# Patient Record
Sex: Female | Born: 1970 | Hispanic: Yes | Marital: Married | Smoking: Never smoker
Health system: Southern US, Community
[De-identification: ages and names within clinical notes are randomized; demographics above are authoritative.]

## PROBLEM LIST (undated history)

## (undated) DIAGNOSIS — T7840XA Allergy, unspecified, initial encounter: Secondary | ICD-10-CM

## (undated) DIAGNOSIS — D649 Anemia, unspecified: Secondary | ICD-10-CM

## (undated) HISTORY — PX: TUBAL LIGATION: SHX77

## (undated) HISTORY — DX: Allergy, unspecified, initial encounter: T78.40XA

## (undated) HISTORY — DX: Anemia, unspecified: D64.9

---

## 1999-07-08 ENCOUNTER — Other Ambulatory Visit: Admission: RE | Admit: 1999-07-08 | Discharge: 1999-07-08 | Payer: Self-pay | Admitting: Gynecology

## 1999-12-16 ENCOUNTER — Ambulatory Visit (HOSPITAL_COMMUNITY): Admission: RE | Admit: 1999-12-16 | Discharge: 1999-12-16 | Payer: Self-pay | Admitting: *Deleted

## 2000-05-13 ENCOUNTER — Inpatient Hospital Stay (HOSPITAL_COMMUNITY): Admission: AD | Admit: 2000-05-13 | Discharge: 2000-05-13 | Payer: Self-pay | Admitting: *Deleted

## 2000-05-18 ENCOUNTER — Inpatient Hospital Stay (HOSPITAL_COMMUNITY): Admission: AD | Admit: 2000-05-18 | Discharge: 2000-05-21 | Payer: Self-pay | Admitting: *Deleted

## 2000-07-26 ENCOUNTER — Encounter: Admission: RE | Admit: 2000-07-26 | Discharge: 2000-07-26 | Payer: Self-pay | Admitting: Obstetrics & Gynecology

## 2001-01-06 ENCOUNTER — Other Ambulatory Visit: Admission: RE | Admit: 2001-01-06 | Discharge: 2001-01-06 | Payer: Self-pay | Admitting: Gynecology

## 2001-01-17 ENCOUNTER — Encounter: Admission: RE | Admit: 2001-01-17 | Discharge: 2001-04-17 | Payer: Self-pay | Admitting: Gynecology

## 2001-07-18 ENCOUNTER — Inpatient Hospital Stay (HOSPITAL_COMMUNITY): Admission: AD | Admit: 2001-07-18 | Discharge: 2001-07-22 | Payer: Self-pay | Admitting: Gynecology

## 2001-07-18 ENCOUNTER — Encounter (INDEPENDENT_AMBULATORY_CARE_PROVIDER_SITE_OTHER): Payer: Self-pay

## 2001-07-24 ENCOUNTER — Encounter: Admission: RE | Admit: 2001-07-24 | Discharge: 2001-08-23 | Payer: Self-pay | Admitting: *Deleted

## 2001-08-31 ENCOUNTER — Other Ambulatory Visit: Admission: RE | Admit: 2001-08-31 | Discharge: 2001-08-31 | Payer: Self-pay | Admitting: Gynecology

## 2003-07-11 ENCOUNTER — Other Ambulatory Visit: Admission: RE | Admit: 2003-07-11 | Discharge: 2003-07-11 | Payer: Self-pay | Admitting: Obstetrics and Gynecology

## 2003-08-20 ENCOUNTER — Ambulatory Visit (HOSPITAL_COMMUNITY): Admission: RE | Admit: 2003-08-20 | Discharge: 2003-08-20 | Payer: Self-pay | Admitting: Obstetrics and Gynecology

## 2004-01-08 ENCOUNTER — Encounter: Admission: RE | Admit: 2004-01-08 | Discharge: 2004-01-08 | Payer: Self-pay | Admitting: *Deleted

## 2004-01-16 ENCOUNTER — Inpatient Hospital Stay (HOSPITAL_COMMUNITY): Admission: RE | Admit: 2004-01-16 | Discharge: 2004-01-19 | Payer: Self-pay | Admitting: Obstetrics & Gynecology

## 2004-01-22 ENCOUNTER — Inpatient Hospital Stay (HOSPITAL_COMMUNITY): Admission: AD | Admit: 2004-01-22 | Discharge: 2004-01-22 | Payer: Self-pay | Admitting: Obstetrics and Gynecology

## 2009-08-25 ENCOUNTER — Encounter (INDEPENDENT_AMBULATORY_CARE_PROVIDER_SITE_OTHER): Payer: Self-pay | Admitting: Obstetrics and Gynecology

## 2009-08-25 ENCOUNTER — Inpatient Hospital Stay (HOSPITAL_COMMUNITY): Admission: RE | Admit: 2009-08-25 | Discharge: 2009-08-28 | Payer: Self-pay | Admitting: Obstetrics and Gynecology

## 2011-01-27 LAB — CBC
HCT: 29.8 % — ABNORMAL LOW (ref 36.0–46.0)
HCT: 37 % (ref 36.0–46.0)
Hemoglobin: 12.4 g/dL (ref 12.0–15.0)
Hemoglobin: 9.9 g/dL — ABNORMAL LOW (ref 12.0–15.0)
MCHC: 33.3 g/dL (ref 30.0–36.0)
MCHC: 33.6 g/dL (ref 30.0–36.0)
MCV: 93.7 fL (ref 78.0–100.0)
MCV: 94.8 fL (ref 78.0–100.0)
Platelets: 131 10*3/uL — ABNORMAL LOW (ref 150–400)
Platelets: 158 10*3/uL (ref 150–400)
RBC: 3.14 MIL/uL — ABNORMAL LOW (ref 3.87–5.11)
RBC: 3.95 MIL/uL (ref 3.87–5.11)
RDW: 14.5 % (ref 11.5–15.5)
RDW: 14.7 % (ref 11.5–15.5)
WBC: 6.1 10*3/uL (ref 4.0–10.5)
WBC: 6.6 10*3/uL (ref 4.0–10.5)

## 2011-01-27 LAB — RPR: RPR Ser Ql: NONREACTIVE

## 2011-03-12 NOTE — Discharge Summary (Signed)
[facility]  Patient:    Tina Porter, Tina Porter Visit Number: [id number] MRN: [id number]          Service Type: Attending:  Nadyne Coombes. Fontaine, M.D. Dictated by:   Antony Contras, [facility]. Date:  07/19/01 Disc. Date: 07/22/01                             Discharge Summary  DISCHARGE DIAGNOSES:          1. Intrauterine pregnancy at term.                               2. History of gestational diabetes.                               3. Cephalopelvic disproportion.                               4. Primary cesarean section with delivery of                                  viable infant.  HISTORY OF PRESENT ILLNESS:   The patient is a 40 year old, primigravida with an LMP of October 05, 2001, [facility] July 12, 2001. Pregnancy was complicated by gestational diabetes which was diet controlled.  PRENATAL LABORATORY DATA:     Blood type O positive, antibody screen negative, RPR, HBsAg, HIV nonreactive. Rubella immune. MSAFP normal. GBS is negative.  HOSPITAL COURSE/TREATMENT:    The patient was admitted on July 19, 2001 with spontaneous rupture of membranes and spontaneous onset of labor. Cervix was high, fingertip, 50%. Fluid was noted to be clear. The patient was augmented with Pitocin. She made minimal progress and it was decided to proceed with low cervical transverse cesarean section. Procedure was performed by Dr. Farrel Gobble assisted by Dr. Salvadore Dom. The patient was delivered of a viable female infant, vertex presentation, overriding the symphysis pubis. Apgars were 8 and 9, birth weight 8 pounds 8 ounces, loose nuchal cord, small sessile fundal fibroid on the left, otherwise unremarkable. Tubes and ovaries were normal.  Postpartum, the patient did have some uterine atony which was managed with Methergine. She also did develop a temperature elevation of 101.4 which did spontaneously resolve. Otherwise, she had no problems voiding and did  well. She was able to be discharged on her third postpartum day in satisfactory condition.  CBC:  Hematocrit 29.6, hemoglobin 9.9, WBCs 7.3, platelets 104,000.  DISPOSITION:                  The patient is to follow up in six weeks, continue prenatal vitamins and iron, and Motrin and Tylox for pain. Dictated by:   Antony Contras, [facility] Attending:  Nadyne Coombes. Audie Box, M.D. DD:  08/11/01 TD:  08/15/01 Job: [id number]

## 2011-03-12 NOTE — Op Note (Signed)
[facility]  Patient:    Tina Porter, Tina Porter Visit Number: [id number] MRN: [id number]          Service Type: OBS Location: 910A 9139 [id number] Attending Physician:  Merrily Pew Dictated by:   Douglass Rivers, M.D. Proc. Date: 07/19/01 Admit Date:  07/18/2001                             Operative Report  PREOPERATIVE DIAGNOSIS:       Cephalopelvic disproportion.  POSTOPERATIVE DIAGNOSIS:      Cephalopelvic disproportion.  PROCEDURE:                    Primary cesarean section low flap transverse.  SURGEON:                      Douglass Rivers, M.D.  ASSISTANT:                    Ed Blalock. Burnadette Peter, M.D.  ANESTHESIA:                   Spinal.  ESTIMATED BLOOD LOSS:         300 cc.  FINDINGS:                     A viable female infant in the vertex presentation overriding the pubic symphysis, Apgars were 8/9, birth weight was 8-8. Loose nuchal cord. There was a small sessile fundal fibroid on the left, otherwise unremarkable. The tubes and ovaries were normal.  PATHOLOGY:                    Placenta.  COMPLICATIONS:                None.  PROCEDURE:                    The patient was taken to the operating room. Spinal anesthesia was induced and she was placed in the supine position, left lateral displacement, prepped and draped in the usual sterile fashion. A Pfannenstiel skin incision was made with a scalpel, carried through the underlying layer of fascia. With electrocautery the fascia was scored in the midline and incision was extended laterally with Mayo scissors. The inferior aspect of the incision was grasped with Kochers. The underlying rectus muscles were dissected off by blunt and sharp dissection. In a similar fashion, the superior aspect of the incision was grasped with Kochers and the underlying rectus muscles were dissected off. The rectus muscles were separated in the midline, the peritoneum was identified and entered bluntly.  The peritoneal incision was then extended superiorly and inferiorly with good visualization of the underlying bowel and bladder, the orientation to the uterus confirmed. The vesicouterine peritoneum was identified. The bladder blade was inserted. The vesicouterine peritoneum was identified, tented up, and entered sharply with the Metzenbaum scissors. The incision was extended laterally. The bladder flap was created digitally. The bladder blade was then reinserted in the lower uterine segment and incised in a transverse fashion with the scalpel. The lower uterine segment was noted to be markedly thin. The infant was delivered from the vertex presentation with the aid of baby Elliot forceps, loose nuchal cord was reduced, and then passed off to the awaiting pediatricians. Cord bloods were obtained. The uterus was incised and the placenta was allowed to separate naturally. The uterus was then  cleared of all clots and debris. The uterine incision was then repaired with a running locked fashion. The gutters were cleared of all clots and debris. The adnexa were inspected. The uterine incision was noted to be hemostatic. Tags were then cut. Also hemostatic underneath the bladder flap, the peritoneum muscles and fascia. The fascia was then closed with 0 Vicryl starting at the apex in a running fashion. The subcutaneous was irrigated. The skin was closed with staples. The patient tolerated the procedure well. Sponge lap and needle counts were correct x 2. She was transferred to the PACU in stable condition. She received Ancef intraoperatively.  INDICATIONS:  DESCRIPTION OF PROCEDURE: Dictated by:   Douglass Rivers, M.D. Attending Physician:  Merrily Pew DD:  07/19/01 TD:  07/19/01 Job: [id number]

## 2011-03-12 NOTE — Discharge Summary (Signed)
Tina Porter, Tina Porter                    ACCOUNT NO.:  [id number]   MEDICAL RECORD NO.:  [id number]                   PATIENT TYPE:  INP   LOCATION:  9127                                 [facility]ILITY:  [facility]   PHYSICIAN:  Lesly Dukes, M.D.              DATE OF BIRTH:  05/20/1969   DATE OF ADMISSION:  01/16/2004  DATE OF DISCHARGE:  01/19/2004                                 DISCHARGE SUMMARY   DISCHARGE DIAGNOSES:  1. Cesarean section.  2. Anemia.  3. Previous cesarean section.   PREVENTATIVE MEDICINE:  The patient discharged on the following medications:  1. Micronor one p.o. daily for birth control purposes.  2. Iron sulfate 325 mg one p.o. daily.   PAIN MEDICINES:  Ibuprofen 600 mg q.6h. as needed.   WOUND CARE:  Per instructions in the booklet.  The patient is to observe for  drainage or erythema.  Staples will be taken out in 2-3 days at [facility].   ACTIVITY:  Light activity.   SEXUAL ACTIVITY:  No sexual activity for 6 weeks.   FOLLOW-UP CARE:  At [facility] in 6 weeks for postpartum checkup.   The patient discharged to home in stable and improved condition with the  above diagnoses and medications.   BRIEF HOSPITAL COURSE:  This is a 40 year old G2 P1 with past medical  history of a previous low transverse C-section who was admitted to the  hospital for repeat low transverse C-section, scheduled.  Patient testing:  She was O positive, Rh negative, RPR nonreactive, rubella immune, HBsAg  negative, gonorrhea and chlamydia negative, GBS negative.  She had normal  prenatal care.  The patient was taken to the OR on January 16, 2004 and  underwent a repeat low transverse cesarean section by Dr. Penne Lash and Dr.  Rondel Jumbo.  Apgars at that point in time were 9 and 9.  The baby weighed 8  pounds 10 ounces.  Estimated blood loss of 800 mL.  The patient was under  spinal anesthesia with no complication.  Postpartum the patient did well,  was breastfeeding without any significant  difficulty.  Had some mild  abdominal pain around the incision site although the incision was clean,  dry, and intact and had some scant lochia.  The patient's pain was well  controlled on ibuprofen 600 mg q.6h.  The patient's hemoglobin did drop from  13 preoperatively to 9.3 postoperatively although hemostasis was achieved  and no significant bleeding postoperatively.  The patient was discharged on  January 19, 2004.  On day of discharge she was doing well,  ambulating well, was breastfeeding well, and had positive bowel sounds and  positive flatulence.  She had scant lochia on the day of discharge.  All of  her staples were left in secondary to mild obesity.  The patient will follow  up in 2-3 days for staple removal and was discharged on January 19, 2004.     Alvira Philips, M.D.  Lesly Dukes, M.D.    RM/MEDQ  D:  03/19/2004  T:  03/19/2004  Job:  [id number]

## 2011-03-12 NOTE — Op Note (Signed)
Tina Porter, Tina Porter                    ACCOUNT NO.:  [id number]   MEDICAL RECORD NO.:  [id number]                   PATIENT TYPE:  INP   LOCATION:  9127                                 [facility]ILITY[facility]  [facility]   PHYSICIAN:  Lesly Dukes, M.D.              DATE OF BIRTH:  05/20/1969   DATE OF PROCEDURE:  DATE OF DISCHARGE:                                 OPERATIVE REPORT   PROCEDURE:  Repeat low transverse cesarean section.   INDICATION:  Repeat.   PRIMARY SURGEON:  Lesly Dukes, M.D.   FIRST ASSISTANT:  Bradly Bienenstock, M.D.   ANESTHESIA:  Spinal.   FINDINGS:  Female infant born at 9:17, Apgars 9 and 9, with 8 pounds, 10  ounces.   ESTIMATED BLOOD LOSS:  800 cc.   PROCEDURE NOTE:  Informed consent was obtained and verified in chart.  The  patient was brought back to the operating suite, and spinal anesthesia was  placed by anesthesia service.  The patient was placed in the dorsal position  and prepped in the normal sterile fashion with Iodine.  A Foley catheter was  placed.  After prep, the patient was sterilely draped in the usual fashion.  Adequacy of anesthesia was verified with Allis clamps.  Once adequate  anesthesia was verified, a Pfannenstiel skin incision was performed, and  subcutaneous tissues were sharply dissected down to fascia.  Fascia was  opened sharply with scalpel and then extended laterally with Mayo scissors.  Underlying rectus tissue was sharply dissected from the fascia.  Secondary  scarring from previous section rectus muscles were opened sharply.  Peritoneum was identified and tented with hemostats and was entered sharply.  Peritoneal incision was extended inferiorly and superiorly first sharply and  then bluntly.  Bladder blade was inserted, and uterovesical peritoneum was  opened sharply in order to create a bladder flap.  Then the bladder flap was  used to retract the bladder down beneath the bladder blade.  The uterus was  then entered sharply  with a scalpel, and incision was extended laterally  with bandage scissors.  Fetal head was delivered through the incision with  assistance of fundal pressure, and the infant was bulb suctioned on the  abdomen prior to delivery of the body.  The body delivered easily.  Cord was  clamped and cut on the table, and the infant was handed to the waiting NICU  team.  Fetal blood sample was taken from umbilical cord.  Infant was  delivered at 9:17 a.m.  Placenta was delivered immediately afterwards and  easily.  A dry lap was used to verify that no placental fragments were  retained in the uterus.  Margins of the uterine incision were then  identified and clamped with Allis clamps to reapproximate the wound.  Pitocin 20 million units was started at this time as well, and the uterus  was massaged to firmness.  After the margins of the uterine incision had  been identified, Ring forceps was used to dilate the cervix, and the dirty  instrument was discarded.  The uterus was closed in two layers, first layer  of 0 Vicryl suture in a running locking fashion, second layer with 0 Vicryl  suture in an imbricating fashion.  Uterus was then irrigated and assessed  for adequate hemostasis.  After adequate  hemostasis had been obtained, the fascial layer was closed with 0 Vicryl  suture in a running fashion.  Subcutaneous tissues were irrigated and when  adequate hemostasis had been obtained, this was closed with staples.  The  patient was doing well at the time of this dictation and had been  transferred to the recovery room.     Bradly Bienenstock, M.D.                         Lesly Dukes, M.D.    Tina Porter  D:  01/16/2004  T:  01/17/2004  Job:  [id number]

## 2013-03-09 ENCOUNTER — Ambulatory Visit: Payer: Self-pay | Admitting: Emergency Medicine

## 2013-03-09 VITALS — BP 90/58 | HR 70 | Temp 98.7°F | Resp 16 | Ht 59.5 in | Wt 134.8 lb

## 2013-03-09 DIAGNOSIS — J309 Allergic rhinitis, unspecified: Secondary | ICD-10-CM

## 2013-03-09 MED ORDER — TRIAMCINOLONE ACETONIDE(NASAL) 55 MCG/ACT NA INHA: 2.0000 | Freq: Every day | NASAL | Status: DC

## 2013-03-09 MED ORDER — HYDROCOD POLST-CHLORPHEN POLST 10-8 MG/5ML PO LQCR: 5.0000 mL | Freq: Two times a day (BID) | ORAL | Status: DC | PRN

## 2013-03-09 NOTE — Patient Instructions (Addendum)
Alergias, en general (Allergies, Generic) El profesional que lo asiste le ha diagnosticado que usted padece de [place]. Las [employer] pueden ser ocasionadas por cualquier cosa a la que su organismo es sensible. Pueden ser alimentos, medicamentos, polen, sustancias qumicas y casi cualquiera de las cosas que lo rodean en su vida diaria que producen alrgenos. Un alrgeno es todo lo que hace que una sustancia produzca alergia. La herencia es uno de los factores que causa este problema. Esto significa que usted puede sufrir alguna de las alergias que sufrieron sus [place]. Las [employer] a la comida pueden ocurrir a Actuary. Estn entre las ms graves y Engineering geologist en peligro la vida. Algunos de los alimentos que comnmente producen [place] son la [place], los frutos de mar, los [place], los frutos secos, el trigo y la soja. SNTOMAS  Hinchazn alrededor de la boca.  Una erupcin roja que produce picazn o urticaria.  Vmitos o diarrea.  Dificultad para respirar. LAS REACCIONES ALRGICAS GRAVES PONEN EN PELIGRO LA VIDA . Esta reaccin se denomina anafilaxis. Puede ocasionar que la boca y la garganta se hinchen y produzca dificultad para respirar y Engineer, manufacturing. En reacciones graves, slo una pequea cantidad del alimento (por ejemplo, aceite de cacahuate en la ensalada) puede producir la muerte en pocos segundos. Las Omnic[employer] pueden ocurrir a Actuary. Se denominan as porque generalmente se producen durante la misma estacin todos los aos. Puede ser Neomia Dear reaccin al moho, al polen del csped o al polen de los rboles. Otras causas del problema son los alrgenos que contienen los caros del polvo del hogar, el pelaje de las mascotas y las esporas del moho. Los sntomas consisten en congestin nasal, picazn y secrecin nasal asociada con estornudos, y lagrimeo y [employer] ojos. Tambin puede haber picazn de la boca y los odos. Estos problemas aparecen cuando se entra en  contacto con el polen y otros alrgenos. Los alrgenos son las partculas que estn en el aire y a las que el organismo reacciona cuando existe una Automotive engineer. Esto hace que usted libere anticuerpos alrgicos. A travs de una cadena de eventos, estos finalmente hacen que usted libere histamina en la corriente sangunea. Aunque esto implica una proteccin para su organismo, es lo que le produce disconfort. Ese es el motivo por el que se le han indicado antihistamnicos para sentirse mejor. Si usted no Counselling psychologist cul es el alrgeno que le produjo la reaccin, puede someterse a una prueba de [place] o de piel. Las alergias no pueden curarse pero pueden controlarse con medicamentos. La fiebre de heno es un grupo de trastornos alrgicos estacionales Simplemente se tratan con medicamentos de venta libre como difenhidramina (Benadryl). Tome los medicamentos segn las indicaciones. No consuma alcohol ni conduzca mientras toma este medicamento. Consulte con el profesional que lo asiste o siga las instrucciones de uso para las dosis para nios. Si estos medicamentos no le Merchant navy officer, existen muchos otros nuevos que el profesional que lo asiste puede prescribirle. Podrn utilizarse medicamentos ms fuertes tales como un spray nasal, colirios y corticoides si los primeros medicamentos que prueba no lo [place]. Si todos estos fracasan, puede Chemical engineer otros tratamientos como la inmunoterapia o las inyecciones desensibilizantes. Haga una consulta de seguimiento con el profesional que lo asiste si los problemas continan. Estas alergias estacionales no ponen en peligro la vida. Generalmente se trata de una incomodidad que puede aliviarse con medicamentos. INSTRUCCIONES PARA EL CUIDADO DOMICILIARIO  Si no est seguro de que es  lo que le produce la reaccin, lleve un registro de los [employer] come y los sntomas que le siguen. Evite los Personal assistant.  Si presenta urticaria o  una erupcin cutnea:  Tome los medicamentos como se le indic.  Puede utilizar un antihistamnico de venta libre (difenhidramina) para la urticaria y Higher education careers adviser, segn sea necesario.  Aplquese compresas sobre la piel o tome baos de agua fra. Evite los baos o las duchas calientes. El calor puede hacer que la urticaria y la picazn empeoren.  Si usted es muy alrgico:  Como consecuencia de un tratamiento para una reaccin grave, puede necesitar ser hospitalizado para recibir un seguimiento intensivo.  Utilice un brazalete o collar de alerta mdico, indicando que usted es Best boy.  Usted y su familia deben aprender a Building services engineer adrenalina o a Chemical engineer un kit anafilctico.  Si usted ya ha sufrido una reaccin grave, siempre lleve el kit anafilctico o el EpiPen con usted. Si sufre una reaccin grave, utilice esta medicacin del modo en que se lo indic el profesional que lo asiste. Una falla puede conllevar consecuencias fatales. SOLICITE ATENCIN MDICA SI:  Sospecha que puede sufrir una alergia a algn alimento. Los sntomas generalmente ocurren dentro de los 30 minutos posteriores a haber ingerido el alimento.  Los sntomas persistieron durante [address]  [address] o han empeorado.  Desarrolla nuevos sntomas.  Quiere volver a probar o que su hijo consuma nuevamente un alimento o bebida que usted cree que le causa una reaccin Counselling psychologist. Nunca lo haga si ha sufrido una reaccin anafilctica a ese alimento o a esa bebida con anterioridad. Slo intntelo bajo la supervisin del mdico. SOLICITE ATENCIN MDICA DE INMEDIATO SI:  Presenta dificultad para respirar, jadea o tiene una sensacin de opresin en el pecho o en la garganta.  Tiene la boca hinchada, o presenta urticaria, hinchazn o picazn en todo el cuerpo.  Ha sufrido una reaccin grave que ha respondido a Engineer, manufacturing systems o al EpiPen. Estas reacciones pueden volver a presentarse cuando haya terminado la  medicacin. Estas reacciones deben considerarse como que ponen en peligro la vida. EST SEGURO QUE:   Comprende las instrucciones para el alta mdica.  Controlar su enfermedad.  Solicitar atencin mdica de inmediato segn las indicaciones. Document Released: 10/11/2005 Document Revised: 01/03/2012 [facility] Patient Information 2013 [place], [place].

## 2013-03-09 NOTE — Progress Notes (Signed)
[facility] [address] [place] [address] [phone]  Date:  03/09/2013   Name:  Tina Porter   DOB:  Jan 30, 1971   MRN:  [id number]  PCP:  No PCP Per Patient    Chief Complaint: Cough, Nasal Congestion and Itchy Eye   History of Present Illness:  Tina Porter is a 42 y.o. very pleasant female patient who presents with the following:  Ill with watery nasal drainage and post nasal drainage.  Sore throat, non productive cough.  No wheezing or shortness of breath.  No nausea or vomiting.  No rash or stool change.  No history of allergies.  No improvement with over the counter medications or other home remedies. Denies other complaint or health concern today.   There are no active problems to display for this patient.   History reviewed. No pertinent past medical history.  Past Surgical History  Procedure Laterality Date  . Cesarean section      History  Substance Use Topics  . Smoking status: Never Smoker   . Smokeless tobacco: Not on file  . Alcohol Use: No    Family History  Problem Relation Age of Onset  . Diabetes Father   . Hyperlipidemia Father   . Allergy (severe) Sister   . Allergy (severe) Brother     No Known Allergies  Medication list has been reviewed and updated.  No current outpatient prescriptions on file prior to visit.   No current facility-administered medications on file prior to visit.    Review of Systems:  As per HPI, otherwise negative.    Physical Examination: Filed Vitals:   03/09/13 1556  BP: 90/58  Pulse: 70  Temp: 98.7 F (37.1 C)  Resp: 16   Filed Vitals:   03/09/13 1556  Height: 4' 11.5" (1.511 m)  Weight: 134 lb 12.8 oz (61.145 kg)   Body mass index is 26.78 kg/(m^2). Ideal Body Weight: Weight in (lb) to have BMI = 25: 125.6  GEN: WDWN, NAD, Non-toxic, A & O x 3 HEENT: Atraumatic, Normocephalic. Neck supple. No masses, No LAD. Ears and Nose: No external deformity. CV: RRR, No M/G/R.  No JVD. No thrill. No extra heart sounds. PULM: CTA B, no wheezes, crackles, rhonchi. No retractions. No resp. distress. No accessory muscle use. ABD: S, NT, ND, +BS. No rebound. No HSM. EXTR: No c/c/e NEURO Normal gait.  PSYCH: Normally interactive. Conversant. Not depressed or anxious appearing.  Calm demeanor.    Assessment and Plan: Seasonal allergic rhinitis Allegra nasacort Robitussin DM   Signed,  Phillips Odor, MD

## 2015-02-27 ENCOUNTER — Other Ambulatory Visit: Payer: Self-pay | Admitting: Emergency Medicine

## 2015-02-27 DIAGNOSIS — Z1231 Encounter for screening mammogram for malignant neoplasm of breast: Secondary | ICD-10-CM

## 2015-03-07 ENCOUNTER — Encounter (HOSPITAL_COMMUNITY): Payer: Self-pay

## 2015-04-03 ENCOUNTER — Encounter (HOSPITAL_COMMUNITY): Payer: Self-pay

## 2015-04-03 ENCOUNTER — Ambulatory Visit (HOSPITAL_COMMUNITY)
Admission: RE | Admit: 2015-04-03 | Discharge: 2015-04-03 | Disposition: A | Discharge status: Home / Self Care | Payer: Self-pay | Source: Ambulatory Visit | Attending: Emergency Medicine | Admitting: Emergency Medicine

## 2015-04-03 ENCOUNTER — Ambulatory Visit (HOSPITAL_COMMUNITY)
Admission: RE | Admit: 2015-04-03 | Discharge: 2015-04-03 | Disposition: A | Discharge status: Home / Self Care | Payer: Self-pay | Source: Ambulatory Visit | Attending: Obstetrics and Gynecology | Admitting: Obstetrics and Gynecology

## 2015-04-03 VITALS — BP 118/74 | Temp 98.4°F | Ht 60.0 in | Wt 130.0 lb

## 2015-04-03 DIAGNOSIS — Z01419 Encounter for gynecological examination (general) (routine) without abnormal findings: Secondary | ICD-10-CM

## 2015-04-03 DIAGNOSIS — Z1231 Encounter for screening mammogram for malignant neoplasm of breast: Secondary | ICD-10-CM

## 2015-04-03 NOTE — Progress Notes (Signed)
CLINIC:  Breast & Cervical Cancer Control Program (BCCCP) Clinic  REASON FOR VISIT: Well-woman exam with routine gynecological exam  HISTORY OF PRESENT ILLNESS:  [name] is a 44 y.o. female who presents to the [facility] today for clinical breast exam and routine gynecological exam.  She does not have a family history of breast cancer.  Her last pap smear was 11/2008 and was negative.  She does not have a history of abnormal pap smears in the past.  She denies any breast or gynecologic complaints today.   REVIEW OF SYSTEMS:  She denies any breast nodularity, skin changes, nipple inversion, or nipple retraction.  She denies any abnormal pelvic pain, pressure, or vaginal bleeding.   ALLERGIES: No Known Allergies  CURRENT MEDICATIONS:  Current Outpatient Prescriptions on File Prior to Encounter  Medication Sig Dispense Refill  . chlorpheniramine-HYDROcodone (TUSSIONEX PENNKINETIC ER) 10-8 MG/5ML LQCR Take 5 mLs by mouth every 12 (twelve) hours as needed. (Patient not taking: Reported on 04/03/2015) 60 mL 0  . fexofenadine (ALLEGRA) 30 MG/5ML suspension Take 30 mg by mouth daily.    Marland Kitchen loratadine (CLARITIN) 10 MG tablet Take 10 mg by mouth daily.    Marland Kitchen triamcinolone (NASACORT AQ) 55 MCG/ACT nasal inhaler Place 2 sprays into the nose daily. (Patient not taking: Reported on 04/03/2015) 1 Inhaler 12   No current facility-administered medications on file prior to encounter.    SOCIAL HISTORY:  [name] is married and has 3 sons, ages 69, 25, and 29.  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   PHYSICAL EXAM:  Vitals:  Filed Vitals:   04/03/15 1043  BP: 118/74  Temp: 98.4 F (36.9 C)    General: Well-nourished, well-appearing female in no acute distress.  She is unaccompanied in clinic today.  Tina Porter, Tina Porter was present during physical exam for this patient.  Breasts: Bilateral breasts exposed and observed  with patient standing (arms at side, arms on hips, arms on hips flexed forward, and arms over head).  No gross abnormalities including breast skin puckering or dimpling noted on observation.  Breasts symmetrical without evidence of skin redness, thickening, or peau d'orange appearance. No nipple retraction or nipple discharge noted bilaterally.  No breast nodularity palpated in bilateral breasts.  Axillary lymph nodes: No axillary lymphadenopathy bilaterally.   GU:  -External genitalia: No lesions, swelling, or discharge. Even hair distribution as expected.  -Vagina: Pink, moist. No lesions. Normal white discharge noted in vaginal canal.  -Cervix: Cervix tilted and pink without lesions. Cervical os patent. Scant amount white cervical discharge noted.  -Uterus: Bimanual exam demonstrates no uterine mass or tenderness on palpation. Uterus in normal position and normal size.  -Adnexae: Bimanual exam demonstrates no ovarian masses or tenderness on palpation.  -Rectovaginal: No lesions noted to rectum. Rectal tone intact.  No masses or nodularity palpated by bimanual rectovaginal exam.     ASSESSMENT & PLAN:   1. Breast cancer screening: [name] has no palpable breast abnormalities on her clinical breast exam today.  She will receive her screening mammogram as scheduled.  She will be contracted by the imaging center for results of the mammogram, either by letter or phone within the next few weeks.  She was given instructions and educational materials regarding breast self-awareness. [name] is aware of this plan and agrees with it.   2. Cervical cancer screening: [name] has a normal pelvic exam today.  A pap  smear was completed today per protocol.  She tolerated the procedure without complaints.  She will be contacted by one of our [employer] nurses in the next few weeks to review the results of the pap smear with the patient.    [name] was encouraged to ask questions and all questions were  answered to her satisfaction.    Lubertha Basque, NP [facility]  [phone]

## 2015-04-07 LAB — CYTOLOGY - PAP

## 2015-04-16 ENCOUNTER — Telehealth (HOSPITAL_COMMUNITY): Payer: Self-pay | Admitting: *Deleted

## 2015-04-16 NOTE — Telephone Encounter (Signed)
Telephoned patient at home # and discussed negative pap smear results. HPV was negative. Next pap due in 3 years. Patient voiced understanding. Used interpreter Julie Sowell.  

## 2016-07-20 ENCOUNTER — Other Ambulatory Visit: Payer: Self-pay | Admitting: Obstetrics and Gynecology

## 2016-07-20 DIAGNOSIS — Z1231 Encounter for screening mammogram for malignant neoplasm of breast: Secondary | ICD-10-CM

## 2016-08-05 ENCOUNTER — Ambulatory Visit (HOSPITAL_COMMUNITY)
Admission: RE | Admit: 2016-08-05 | Discharge: 2016-08-05 | Disposition: A | Discharge status: Home / Self Care | Payer: Self-pay | Source: Ambulatory Visit | Attending: Obstetrics and Gynecology | Admitting: Obstetrics and Gynecology

## 2016-08-05 ENCOUNTER — Encounter (HOSPITAL_COMMUNITY): Payer: Self-pay

## 2016-08-05 ENCOUNTER — Ambulatory Visit (INDEPENDENT_AMBULATORY_CARE_PROVIDER_SITE_OTHER): Payer: Self-pay | Admitting: Physician Assistant

## 2016-08-05 ENCOUNTER — Ambulatory Visit
Admission: RE | Admit: 2016-08-05 | Discharge: 2016-08-05 | Disposition: A | Discharge status: Home / Self Care | Payer: No Typology Code available for payment source | Source: Ambulatory Visit | Attending: Obstetrics and Gynecology | Admitting: Obstetrics and Gynecology

## 2016-08-05 VITALS — BP 122/70 | Temp 98.7°F | Ht 60.0 in | Wt 136.4 lb

## 2016-08-05 VITALS — BP 100/64 | HR 75 | Temp 98.5°F | Resp 16 | Ht 60.0 in | Wt 136.4 lb

## 2016-08-05 DIAGNOSIS — Z1239 Encounter for other screening for malignant neoplasm of breast: Secondary | ICD-10-CM

## 2016-08-05 DIAGNOSIS — Z1231 Encounter for screening mammogram for malignant neoplasm of breast: Secondary | ICD-10-CM

## 2016-08-05 DIAGNOSIS — M7711 Lateral epicondylitis, right elbow: Secondary | ICD-10-CM

## 2016-08-05 MED ORDER — MELOXICAM 7.5 MG PO TABS: 7.5000 mg | ORAL_TABLET | Freq: Every day | ORAL | 0 refills | Status: DC

## 2016-08-05 NOTE — Progress Notes (Addendum)
Error

## 2016-08-05 NOTE — Patient Instructions (Addendum)
Take this medication for your pain. You can take one in the morning and one at night, or two at once. Please see the below information for information about your elbow.   You can buy an arm brace for "medial epicondylitis" aka tennis elbow at [facility]: Address: [address] [place], [place] [address]  Hours:  Open today  9AM-5:30PM                       Phone: [phone]   Thank you for coming in today. I hope you feel we met your needs.  Feel free to call [facility] if you have any questions or further requests.  Please consider signing up for MyChart if you do not already have it, as this is a great way to communicate with me.  Best,  Whitney McVey, PA-C   IF you received an x-ray today, you will receive an invoice from [facility]. Please contact [facility] at [phone] with questions or concerns regarding your invoice.   IF you received labwork today, you will receive an invoice from [employer]. Please contact [facility] at [phone] with questions or concerns regarding your invoice.   Our billing staff will not be able to assist you with questions regarding bills from these companies.  You will be contacted with the lab results as soon as they are available. The fastest way to get your results is to activate your My Chart account. Instructions are located on the last page of this paperwork. If you have not heard from [place] regarding the results in 2 weeks, please contact this office.      Epicondilitis lateral con rehabilitacin (Lateral Epicondylitis With Rehab) La epicondilitis lateral consiste en la inflamacin y dolor alrededor de la regin externa del codo. El dolor tiene su origen en la inflamacin de los tendones del antebrazo que extienden la [place]. La epicondilitis lateral, tambin es denominada codo de [place] debido a que es muy frecuente [employer] jugadores de tenis. Sin embargo, Facilities manager individuo que extienda la mueca de [place] repetitiva. Si esta afeccin no se trata, puede transformarse en un problema crnico. SNTOMAS  Dolor y sensibilidad e inflamacin en la zona externa (lateral) del codo.  Dolor o debilidad al tomar [employer].  Dolor que [place] con los movimientos de rotacin de la mueca (jugar al tenis, usar un Information systems manager, abrir una puerta o un frasco).  Dolor al levantar objetos, inclusive [place] taza de caf. CAUSAS  La causa de la epicondilitis lateral es la inflamacin de los tendones que extienden la [place]. Entre las causas se incluyen:  Psychologist, forensic repetitivo y distensin de los msculos y los tendones que extienden la [place].  Cambio repentino en el nivel o intensidad de la [place].  Agarre incorrecto en los deportes con raqueta.  Tamao incorrecto del puo de la raqueta (con frecuencia demasiado grande).  Posicin o tcnica incorrecta al [employer] un golpe (generalmente con el dorso de la mano; llevada por el codo).  Utilizar una raqueta demasiado pesada. LOS RIESGOS AUMENTAN CON:  Los deportes u ocupaciones que requieren movimientos repetitivos y extenuantes del antebrazo y la [place] (tenis, squash, deportes con raqueta, carpintera).  Poca fuerza y flexibilidad de la [place] y [place].  Precalentamiento y elongacin inadecuados antes de la [place].  Regreso a la actividad antes de [employer] curacin, la rehabilitacin y Museum/gallery exhibitions officer. PREVENCIN  Precalentamiento adecuado y elongacin antes de la [place].  Mantener la forma fsica:  Kerry Hough, flexibilidad y resistencia muscular.  Capacidad cardiovascular.  Utilice un equipo que le ajuste adecuadamente.  Usar la tcnica correcta y Best boy un entrenador que corrija la tcnica incorrecta.  Use un vendaje para el codo apropiado para el tenis (contrafuerza). PRONSTICO El curso de la enfermedad depende del grado de la lesin. Si se trata adecuadamente, los casos agudos  (sntomas que duran menos de 4 semanas) generalmente se resuelven en un perodo de 2 a 6 semanas. Los casos crnicos (que duran ms tiempo) se resuelven en un lapso de 3 a 6 meses, pero pueden requerir de tratamiento fisioteraputico. COMPLICACIONES RELACIONADAS  La recurrencia frecuente de los sntomas puede dar como resultado un problema crnico. Un tratamiento adecuado en su inicio disminuye la probabilidad de recurrencias.  La inflamacin crnica, degeneracin cicatrizal del tendn y ruptura parcial del tendn, requieren [place].  Demora de la curacin o de la resolucin de los sntomas. TRATAMIENTO El tratamiento inicial consiste en la toma de medicamentos y la aplicacin de hielo para Best boy y reducir la hinchazn. Los ejercicios de elongacin y fortalecimiento pueden ayudar a reducir las molestias si se realizan con regularidad. Podr realizar estos ejercicios en su casa, si se trata de una afeccin aguda. Los casos crnicos pueden requerir la derivacin a un fisioterapeuta para Film/video editor evaluacin y Manufacturing systems engineer. Su mdico podr indicarle inyecciones con corticoides para reducir la inflamacin. Raras veces es necesario someterse a [place]. MEDICAMENTOS   Si es necesaria la administracin de medicamentos para Conservation officer, historic buildings, se recomiendan los antiinflamatorios no esteroides, (aspirina e ibuprofeno) u otros calmantes menores (acetaminofeno).  No tome medicamentos para el dolor dentro de los 7 das previos a la [place].  El profesional podr prescribirle calmantes si lo considera necesario. Utilcelos como se le indique y slo cuando lo necesite.  Se podrn recomendar inyecciones de corticoesteorides. Estas inyecciones deben reservarse para los casos graves, porque slo se pueden administrar una determinada cantidad de veces. CALOR Y FRO  El fro debe aplicarse durante 10 a 15 minutos cada 2  3 horas para reducir la inflamacin y Conservation officer, historic buildings e inmediatamente despus  de cualquier actividad que agrava los sntomas. Utilice bolsas o un masaje de hielo.  El calor puede usarse antes de Neurosurgeon y [place] fortalecimiento indicadas por el profesional, le fisioterapeuta o Industrial/product designer. Utilice una bolsa trmica o un pao hmedo. SOLICITE ATENCIN MDICA SI: Los sntomas empeoran o no mejoran en 2 semanas, a pesar de Chiropodist. EJERCICIOS EJERCICIOS DE AMPLITUD DE MOVIMIENTOS Y ELONGACIN - Epicondilitis lateral (codo de [place]) Estos ejercicios le ayudarn en la recuperacin de la lesin. Los sntomas podrn desaparecer con o sin mayor intervencin del profesional, el fisioterapeuta o Industrial/product designer. Al completar estos ejercicios, recuerde:   Restaurar la flexibilidad del tejido ayuda a que las articulaciones recuperen el movimiento normal. Esto permite que el movimiento y la actividad sea ms saludables y menos dolorosos.  Para que sea efectiva, cada elongacin debe realizarse durante al menos 30 segundos.  La elongacin nunca debe ser dolorosa. Deber sentir slo un alargamiento suave o elongacin del tejido que estira. [place] de la [place], asistida  Extienda el codo derecha / izquierdo con los dedos apuntando [place] abajo.*  Tire suavemente la palma de la mano hacia usted, hasta que sienta un ligero estiramiento de la parte superior del [place].  Mantenga esta posicin durante __________ segundos. Reptalo __________ veces. Realice este estiramiento __________ Vicenta Aly por da.  *Realice  este ejercicio con el codo flexionado en lugar de extendido si el mdico, fisioterapeuta o entrenador se lo indican. [place] de la [place], asistida  Extienda el codo derecha / izquierdo con la palma Edison arriba.*  Tire suavemente la palma y la punta de los dedos [place] atrs, para que la [place] se extienda y los dedos apunten hacia el suelo.  Debe sentir un ligero estiramiento en la parte  interior del antebrazo.  Mantenga esta posicin durante __________segundos. Reptalo __________ veces. Realice este estiramiento __________ Vicenta Aly por da. *Realice este ejercicio con el codo flexionado en lugar de extendido si el mdico, fisioterapeuta o entrenador se lo indican. FUERZA - Flexin de la [employer] la palma de la mano derecha / izquierdo plana sobre una mesa, con el codo ligeramente doblado. Los dedos deben apuntar hacia el lado contrario del cuerpo.  Presione suavamente la parte de atrs de la mano en la mesa y enderece el codo. Debe sentir un ligero estiramiento en la parte superior del antebrazo.  Mantenga esta posicin durante __________ segundos. Reptalo __________ veces. Realice este estiramiento __________ Vicenta Aly por da.  [place] de la [employer] puntas de los dedos de la mano derecha / izquierdo plana sobre una mesa, con el codo ligeramente doblado. Los dedos deben apuntar [employer].  Presione suavamente los dedos y la mano en la mesa y enderece el codo. Debe sentir un ligero estiramiento en la parte interna del antebrazo.  Mantenga esta posicin durante __________ segundos. Reptalo __________ veces. Realice este estiramiento __________ Vicenta Aly por da.  EJERCICIOS DE FORTALECIMIENTO - Epicondilitis lateral (codo de [place]) Estos ejercicios le ayudarn en la recuperacin de la lesin. Los sntomas podrn desaparecer con o sin mayor intervencin del profesional, el fisioterapeuta o Industrial/product designer. Al completar estos ejercicios, recuerde:   Los msculos pueden ganar tanto la resistencia como la fuerza que necesita para sus actividades diarias a travs de ejercicios controlados.  Realice los ejercicios como se lo indic el mdico, el fisioterapeuta o Industrial/product designer. Aumente la resistencia y repeticiones segn se le haya indicado.  Podr experimentar dolor o cansancio muscular, pero el dolor o molestia que trata de eliminar a travs de los  ejercicios nunca debe empeorar. Si el dolor empeora, detngase y asegrese de que est siguiendo las directivas correctamente. Si an siente dolor luego de Optometrist lo ajustes necesarios, deber discontinuar el ejercicio hasta que pueda conversar con el profesional sobre el problema. FUERZA - Flexores de la mueca  Sintese con el antebrazo derecha / izquierdo con la palma hacia arriba y completamente apoyado sobre una mesa o [place]. El codo Neurosurgeon en reposo y a la altura de los hombros. Haga que la [place] se extienda sobre los extremos de la superficie.  Sostenga sin apretar un peso de __________, Merla Riches goma o tubo para ejercicios en ambas manos, y doble lentamente la mano hacia el [place].  Mantenga esta posicin durante __________ segundos. Baje lentamente la [employer] posicin inicial, de forma controlada. Reptalo __________ veces. Realice este estiramiento __________ Vicenta Aly por da.  FUERZA - Extensores de la mueca  Sintese con el antebrazo derecha / izquierdo con la palma hacia abajo y completamente apoyado sobre una mesa o [place]. El codo Neurosurgeon en reposo y a la altura de los hombros. Haga que la [place] se extienda sobre los extremos de la superficie.  Sostenga sin apretar un peso de __________, Merla Riches goma o tubo para ejercicios en  ambas manos, y doble lentamente la mano hacia el [place].  Mantenga esta posicin durante __________ segundos. Baje lentamente la [employer] posicin inicial, de forma controlada. Reptalo __________ veces. Realice este estiramiento __________ Vicenta Aly por da.  FUERZA - Desviacin ulnar  Prese sosteniendo un peso de ____________________ en su mano derecha / izquierdo, o sintese sosteniendo una banda de goma para ejercicios, con el brazo sano apoyado en una mesa o mesada.  Mueva la [place], para que el dedo meique apunte hacia el antebrazo y Counselling psychologist en contra del mismo.  Mantenga esta posicin durante __________ segundos y luego  lentamente baje la mueca a la posicin inicial. Reptalo __________ veces. Realice este ejercicio __________ veces por da. FUERZA - Desviacin radial  Prese sosteniendo un peso de ____________________ en su mano derecha / izquierdo, o sintese sosteniendo una banda de goma para ejercicios, con el brazo lesionado apoyado en una mesa o [place].  Eleve la mano hacia arriba, por delante suyo o tire [place] arriba la banda de [place].  Mantenga esta posicin durante __________ segundos y luego lentamente baje la mueca a la posicin inicial. Reptalo __________ veces. Realice este estiramiento __________ Vicenta Aly por da. FUERZA - Supinadores del antebrazo  Sintese con su antebrazo derecha / izquierdo apoyado sobre una mesa, manteniendo el codo por debajo de la altura del hombro. Apoye la [employer] borde, con la palma [place].  Suavemente tome un martillo o un cucharn de sopa.  Sin mover el codo, gire lentamente la palma y la mano hacia arriba para colocar el "pulgar arriba".  Mantenga esta posicin durante __________ segundos. Vuelva lentamente a la posicin inicial. Reptalo __________ veces. Realice este estiramiento __________ Vicenta Aly por da.  FUERZA - Pronadores del antebrazo  Sintese con su antebrazo derecha / izquierdo apoyado sobre una mesa, manteniendo el codo por debajo de la altura del hombro. Apoye la [employer] borde, con la palma [place].  Suavemente tome un martillo o un cucharn de sopa.  Sin mover el codo, gire lentamente la palma y la mano hacia arriba para colocar el "pulgar arriba".  Mantenga esta posicin durante __________ segundos. Vuelva lentamente a la posicin inicial. Reptalo __________ veces. Realice este estiramiento __________ Vicenta Aly por da.  [place] una pelota de tenis, una esponja dura o una media larga y [place].  Apritela lo ms fuerte que pueda, sin Corporate treasurer.  Mantenga esta posicin durante __________ segundos.  Sultela lentamente. Reptalo __________ veces. Realice este estiramiento __________ Vicenta Aly por da.  FUERZA - Extensores del codo, isomtrica  Prese o sintese erguido sobre una superficie firme. Coloque su brazo Recruitment consultant / izquierdo de modo que la palma de su mano quede frente al [place] y a la altura de su cintura.  Coloque la mano opuesta sobre lado inferior del [place]. Empuje suavemente hacia arriba mientras su brazo derecha / izquierdo opone resistencia. Empuje tan intensamente como pueda con ambos brazos sin causar Counselling psychologist ni realizar movimientos con su codo derecha / izquierdo. Mantenga esta posicin durante __________ segundos. Libere la tensin de ambos brazos gradualmente. Permita que sus msculos se relajen completamente antes de repetir.   Esta informacin no tiene Marine scientist el consejo del mdico. Asegrese de hacerle al mdico cualquier pregunta que tenga.   Document Released: 07/28/2006 Document Revised: 02/25/2015 Elsevier Interactive Patient Education [employer].

## 2016-08-05 NOTE — Progress Notes (Signed)
No complaints today.   Pap Smear:  Pap smear not completed today. Last Pap smear was 04/03/2015 in BCCCP clinic and normal with negative HPV with an absent endocervical transformation zone. Per patient no history of an abnormal Pap smear. Next Pap smear due June 2019. Last Pap smear result is in EPIC.  Physical exam: Breasts Breasts symmetrical. No skin abnormalities bilateral breasts. No nipple retraction bilateral breasts. No nipple discharge bilateral breasts. No lymphadenopathy. No lumps palpated bilateral breasts. No complaints of pain or tenderness on exam. [facility]. Appointment scheduled for Thursday, August 05, 2016 at 1020.        Pelvic/Bimanual No Pap smear completed today since last Pap smear was 04/03/2015. Pap smear not indicated per BCCCP guidelines.   Smoking History: Patient has never smoked.  Patient Navigation: Patient education provided. [facility].

## 2016-08-05 NOTE — Patient Instructions (Signed)
Explained breast self awareness with Tina Porter. Patient did not need a Pap smear today due to last Pap smear and HPV typing was 04/03/2015. Let her know BCCCP will cover Pap smears every 3 years unless has a history of abnormal Pap smears. [facility]. Appointment scheduled for Thursday, August 05, 2016 at 1020. Let patient know the Breast Center will follow up with her within the next couple weeks with results of mammogram by letter or phone. Tina Porter verbalized understanding.  Tina Porter, Tina Maserhristine Poll, RN 11:22 AM

## 2016-08-09 ENCOUNTER — Encounter (HOSPITAL_COMMUNITY): Payer: Self-pay | Admitting: *Deleted

## 2016-08-10 NOTE — Progress Notes (Signed)
Tina Porter is a 45 year old female who presents to clinic for right elbow pain x two months. She cleans houses for a living. Pain increases when she is in bed at night and occasionally while she is working. Pain is located on the outside of her right arm. She has not tried anything for her pain. Denies MOI, sensory changes, increase in temperature at joints, decreased range of motion or strength.  She has never had this before.  Also notes pain in the joints of all of her fingers, right and left hands.   Review of Systems  Constitutional: Negative for chills, diaphoresis and fever.  HENT: Negative for congestion.   Respiratory: Negative for cough and shortness of breath.   Cardiovascular: Negative for chest pain and palpitations.  Musculoskeletal: Positive for joint pain (fingers ) and myalgias (right elbow). Negative for back pain and falls.  Neurological: Negative for dizziness, sensory change, weakness and headaches.     There are no active problems to display for this patient.   Current Outpatient Prescriptions on File Prior to Visit  Medication Sig Dispense Refill  . fexofenadine (ALLEGRA) 30 MG/5ML suspension Take 30 mg by mouth daily.    Marland Kitchen. loratadine (CLARITIN) 10 MG tablet Take 10 mg by mouth daily.    Marland Kitchen. triamcinolone (NASACORT AQ) 55 MCG/ACT nasal inhaler Place 2 sprays into the nose daily. 1 Inhaler 12   No current facility-administered medications on file prior to visit.     No Known Allergies  Objective:  BP 100/64 (BP Location: Right Arm, Patient Position: Sitting, Cuff Size: Small)   Pulse 75   Temp 98.5 F (36.9 C) (Oral)   Resp 16   Ht 5' (1.524 m)   Wt 136 lb 6.4 oz (61.9 kg)   LMP 07/14/2016 (Approximate)   SpO2 98%   BMI 26.64 kg/m   Physical Exam  Constitutional: She is oriented to person, place, and time and well-developed, well-nourished, and in no distress. No distress.  Cardiovascular: Normal rate, regular rhythm and normal heart sounds.    Musculoskeletal:       Right elbow: She exhibits normal range of motion, no swelling, no effusion, no deformity and no laceration. Tenderness found. Lateral epicondyle tenderness noted. No medial epicondyle and no olecranon process tenderness noted.  Pain with passive wrist flexion and extension with elbow in full extension. Pain is relieved with pressure applied to proximal forearm. No sensory deficits noted.   Neurological: She is alert and oriented to person, place, and time. GCS score is 15.  Skin: Skin is warm and dry.  Psychiatric: Mood, memory, affect and judgment normal.  Vitals reviewed.   Assessment and Plan :  1. Lateral epicondylitis of right elbow - meloxicam (MOBIC) 7.5 MG tablet; Take 1 tablet (7.5 mg total) by mouth daily.  Dispense: 30 tablet; Refill: 0 - patient [facility]. Information given. Stretches demonstrated and printed out for patient. She understands and agrees with plan. RTC if no improvement.    Marco CollieWhitney Knolan Simien, PA-C  [facility] 08/05/2016 11:13 AM

## 2017-11-07 ENCOUNTER — Other Ambulatory Visit: Payer: Self-pay | Admitting: Obstetrics and Gynecology

## 2017-11-07 DIAGNOSIS — Z1231 Encounter for screening mammogram for malignant neoplasm of breast: Secondary | ICD-10-CM

## 2017-12-01 ENCOUNTER — Ambulatory Visit (HOSPITAL_COMMUNITY)
Admission: RE | Admit: 2017-12-01 | Discharge: 2017-12-01 | Disposition: A | Discharge status: Home / Self Care | Payer: Self-pay | Source: Ambulatory Visit | Attending: Obstetrics and Gynecology | Admitting: Obstetrics and Gynecology

## 2017-12-01 ENCOUNTER — Ambulatory Visit
Admission: RE | Admit: 2017-12-01 | Discharge: 2017-12-01 | Disposition: A | Discharge status: Home / Self Care | Payer: No Typology Code available for payment source | Source: Ambulatory Visit | Attending: Obstetrics and Gynecology | Admitting: Obstetrics and Gynecology

## 2017-12-01 ENCOUNTER — Encounter (HOSPITAL_COMMUNITY): Payer: Self-pay

## 2017-12-01 VITALS — BP 102/68 | Ht 60.0 in

## 2017-12-01 DIAGNOSIS — Z1239 Encounter for other screening for malignant neoplasm of breast: Secondary | ICD-10-CM

## 2017-12-01 DIAGNOSIS — Z1231 Encounter for screening mammogram for malignant neoplasm of breast: Secondary | ICD-10-CM

## 2017-12-01 NOTE — Progress Notes (Signed)
No complaints today.   Pap Smear: Pap smear not completed today. Last Pap smear was 04/03/2015 in [facility] clinic and normal with negative HPV with an absent endocervical transformation zone. Per patient has no history of an abnormal Pap smear. Next Pap smear due June 2019. Last Pap smear result is in EPIC.  Physical exam: Breasts Breasts symmetrical. No skin abnormalities bilateral breasts. No nipple retraction bilateral breasts. No nipple discharge bilateral breasts. No lymphadenopathy. No lumps palpated bilateral breasts. No complaints of pain or tenderness on exam. [facility]. Appointment scheduled for Thursday, December 01, 2017 at 1410.   Pelvic/Bimanual No Pap smear completed today since last Pap smear and HPV typing was 04/03/2015. Pap smear not indicated per [facility] guidelines.   Smoking History: Patient has never smoked.  Patient Navigation: Patient education provided. [facility].

## 2017-12-01 NOTE — Patient Instructions (Addendum)
Explained breast self awareness with Tina Porter. Patient did not need a Pap smear today due to last Pap smear and HPV typing was 04/03/2015. Let her know [facility] will cover Pap smears every 3 years unless has a history of abnormal Pap smears. [facility]. Appointment scheduled for Thursday, December 01, 2017 at 1410. Let patient know the Breast Center will follow up with her within the next couple weeks with results of mammogram by letter or phone. Tina Porter verbalized understanding.  [place], Kathaleen Maserhristine Poll, RN 2:01 PM

## 2017-12-02 ENCOUNTER — Encounter (HOSPITAL_COMMUNITY): Payer: Self-pay | Admitting: *Deleted

## 2018-09-27 NOTE — Progress Notes (Signed)
[name] received her flu shot to her LT deltoid [facility] 12[facility]3 [facility] by the undersigned. Lot#[id number] NDC: [id number] Mfg: [id number] Exp: 04/24/19

## 2019-05-31 IMAGING — MG DIGITAL SCREENING BILATERAL MAMMOGRAM WITH TOMO AND CAD
8 of 12 series · 8 of 28 positions shown · non-contrast
Comparison: Previous exam(s).

CLINICAL DATA: Screening.

EXAM:
DIGITAL SCREENING BILATERAL MAMMOGRAM WITH TOMO AND CAD

[R MLO]
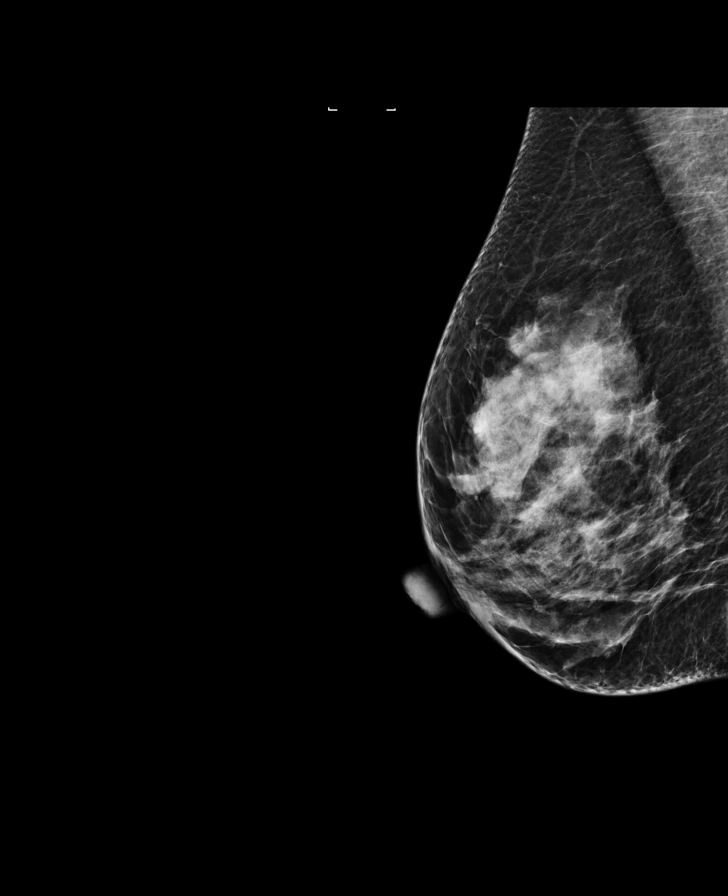

[R CC synth-2D]
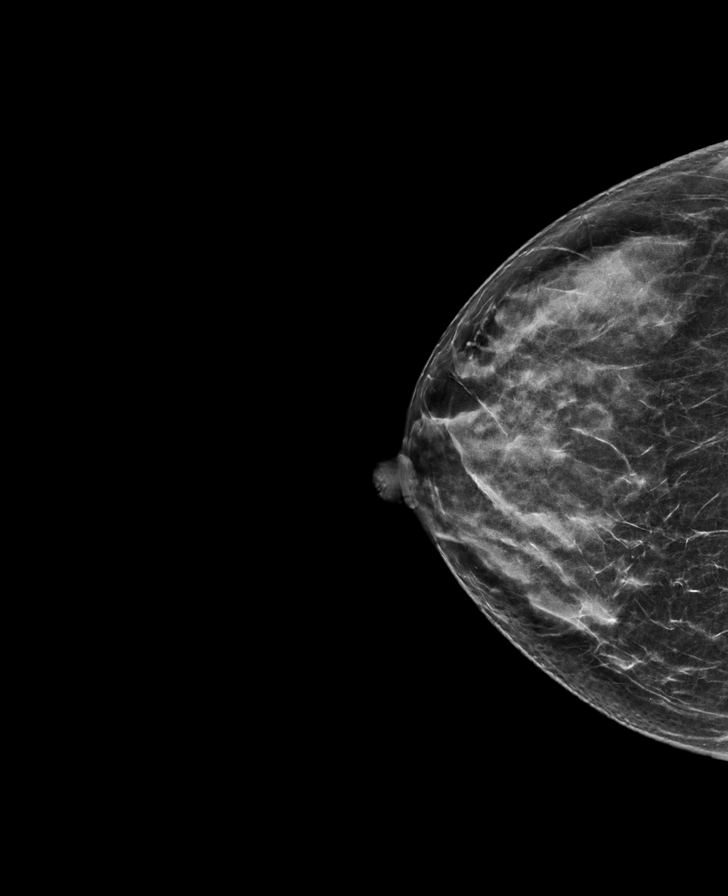

[R MLO synth-2D]
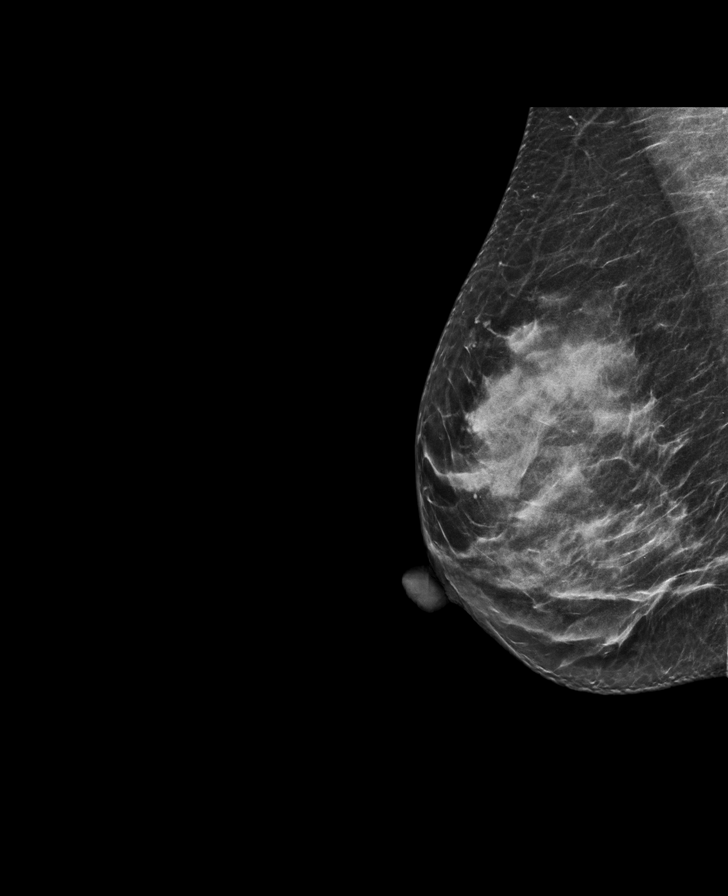

[L CC synth-2D]
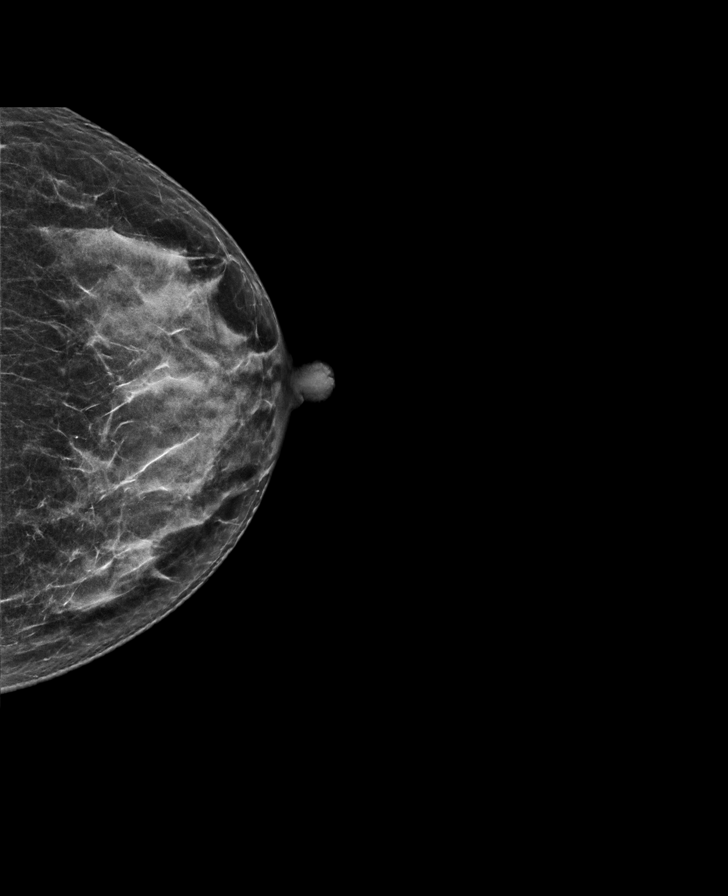

[L MLO]
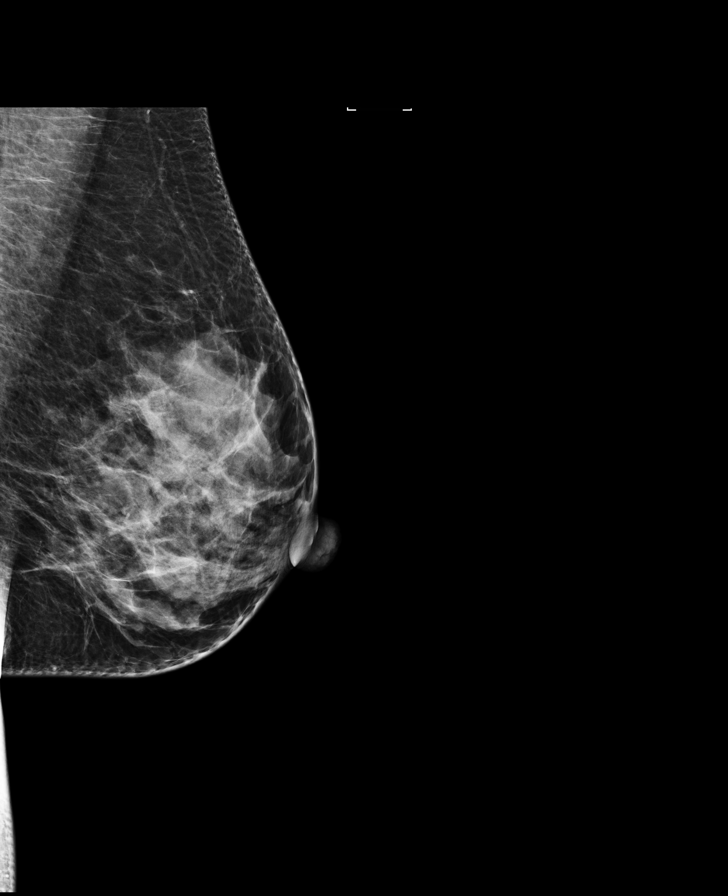

[L CC]
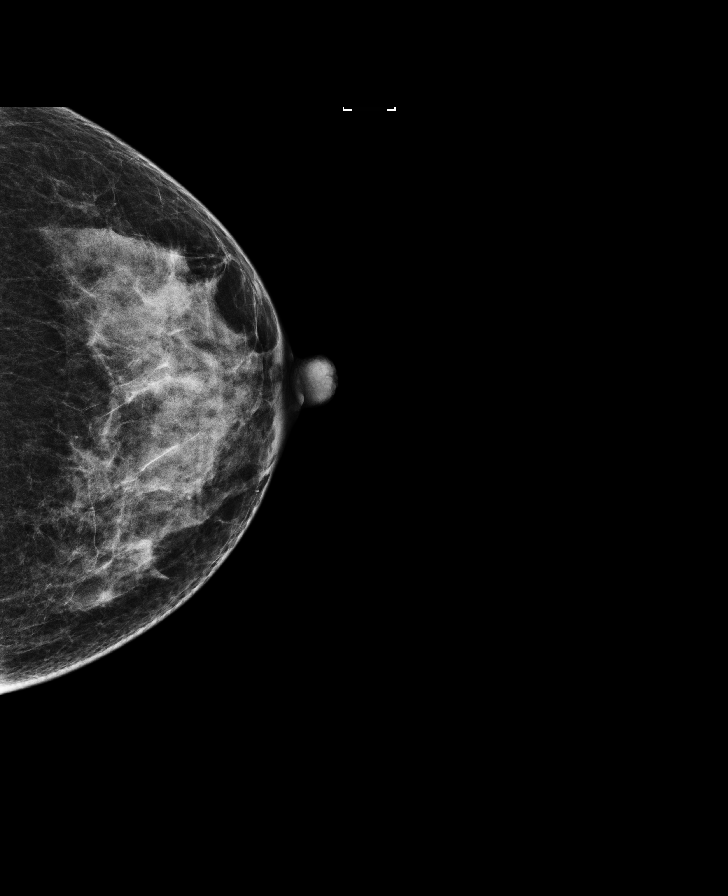

[R CC]
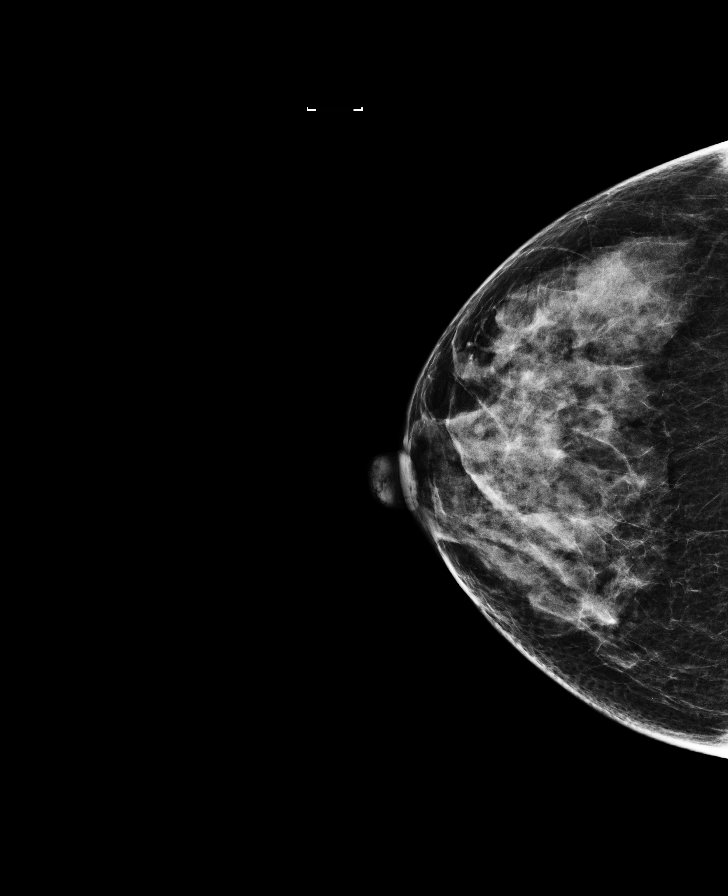

[L MLO synth-2D]
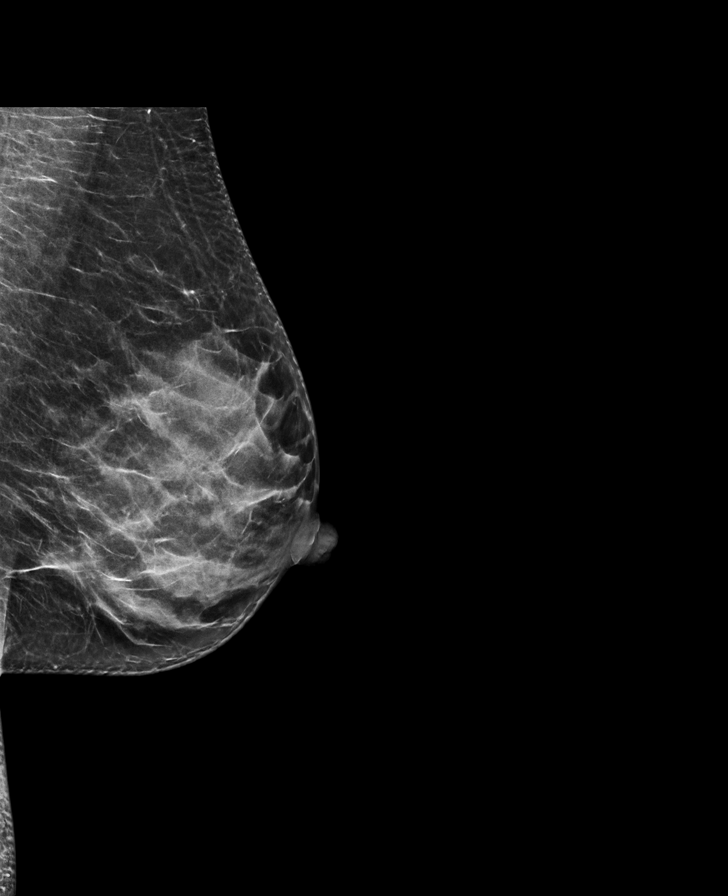

[8 of 28 positions shown; findings below may reference images not displayed]

ACR Breast Density Category d: The breast tissue is extremely dense,
which lowers the sensitivity of mammography.
FINDINGS: There are no findings suspicious for malignancy. Images were
processed with CAD.
IMPRESSION: No mammographic evidence of malignancy. A result letter of this
screening mammogram will be mailed directly to the patient.

RECOMMENDATION:
Screening mammogram in one year. (Code:RA-I-AVB)

BI-RADS CATEGORY  1: Negative.

## 2019-08-15 ENCOUNTER — Other Ambulatory Visit (HOSPITAL_COMMUNITY): Payer: Self-pay | Admitting: *Deleted

## 2019-08-15 DIAGNOSIS — N644 Mastodynia: Secondary | ICD-10-CM

## 2019-09-27 ENCOUNTER — Other Ambulatory Visit: Payer: No Typology Code available for payment source

## 2019-09-27 ENCOUNTER — Ambulatory Visit (HOSPITAL_COMMUNITY): Payer: No Typology Code available for payment source

## 2019-10-12 ENCOUNTER — Other Ambulatory Visit: Payer: Self-pay | Admitting: Cardiology

## 2019-10-12 DIAGNOSIS — Z20822 Contact with and (suspected) exposure to covid-19: Secondary | ICD-10-CM

## 2019-10-13 LAB — NOVEL CORONAVIRUS, NAA: SARS-CoV-2, NAA: NOT DETECTED

## 2019-10-16 ENCOUNTER — Ambulatory Visit (HOSPITAL_COMMUNITY): Payer: No Typology Code available for payment source

## 2020-04-03 ENCOUNTER — Other Ambulatory Visit: Payer: Self-pay

## 2020-04-03 ENCOUNTER — Encounter: Payer: Self-pay | Admitting: Vascular Surgery

## 2020-04-03 ENCOUNTER — Ambulatory Visit (INDEPENDENT_AMBULATORY_CARE_PROVIDER_SITE_OTHER): Payer: Self-pay | Admitting: Vascular Surgery

## 2020-04-03 VITALS — BP 93/58 | HR 58 | Temp 97.9°F | Resp 18 | Ht 60.0 in | Wt 122.0 lb

## 2020-04-03 DIAGNOSIS — I83813 Varicose veins of bilateral lower extremities with pain: Secondary | ICD-10-CM

## 2020-04-03 NOTE — Progress Notes (Signed)
REASON FOR CONSULT:    Bilateral lower extremity varicose veins.  The consult is requested by Dr. Isidor Holts.  ASSESSMENT & PLAN:   BILATERAL VARICOSE VEINS: This patient has some varicose veins in both eyes which are mildly dilated.  She is not experiencing  significant symptoms associated with these varicosities.  I have reassured her that these are not dangerous but that venous disease does tend to gradually progress.  For this reason we have discussed several measures to help prevent progression of her disease and help prevent symptoms related to venous disease.  Specifically, we have discussed the importance of intermittent leg elevation and the proper positioning for this.  In addition we have fitted her for knee-high compression stockings with a gradient of 15 to 20 mmHg.  I have encouraged her to avoid prolonged sitting and standing.  We have discussed the importance of exercise specifically walking and water aerobics.  Certainly I had be happy to see her back at any time if her venous disease progresses.   Waverly Ferrari, MD Office: [phone]   HPI:   Tina Porter is a pleasant 49 y.o. female, who is referred with varicose veins.  I have reviewed the records from the referring office.  The patient has a history of a Covid infection in October 2020.  She also has a history of iron deficiency anemia.  Patient states that she has had varicose veins in both legs for about 3 years.  These are mostly in her thigh.  She denies any aching pain or heaviness in her legs.  She denies significant swelling.  She works on her feet for most of the day.  She has not been wearing compression stockings.  She does not elevate her legs.  She is had no previous history of DVT.  She is had no previous venous procedures.  She is otherwise very healthy.  Past Medical History:  Diagnosis Date  . Allergy   . Anemia     Family History  Problem Relation Age of Onset  . Varicose Veins Mother   .  Diabetes Father   . Hyperlipidemia Father   . Allergy (severe) Sister   . Allergy (severe) Brother   . Breast cancer Neg Hx     SOCIAL HISTORY: Social History   Socioeconomic History  . Marital status: Married    Spouse name: Not on file  . Number of children: Not on file  . Years of education: Not on file  . Highest education level: Not on file  Occupational History  . Not on file  Tobacco Use  . Smoking status: Never Smoker  . Smokeless tobacco: Never Used  Vaping Use  . Vaping Use: Never used  Substance and Sexual Activity  . Alcohol use: No  . Drug use: No  . Sexual activity: Yes    Birth control/protection: Surgical  Other Topics Concern  . Not on file  Social History Narrative  . Not on file   Social Determinants of Health   Financial Resource Strain:   . Difficulty of Paying Living Expenses:   Food Insecurity:   . Worried About Programme researcher, broadcasting/film/video in the Last Year:   . Barista in the Last Year:   Transportation Needs:   . Freight forwarder (Medical):   Marland Kitchen Lack of Transportation (Non-Medical):   Physical Activity:   . Days of Exercise per Week:   . Minutes of Exercise per Session:   Stress:   .  Feeling of Stress :   Social Connections:   . Frequency of Communication with Friends and Family:   . Frequency of Social Gatherings with Friends and Family:   . Attends Religious Services:   . Active Member of Clubs or Organizations:   . Attends Archivist Meetings:   Marland Kitchen Marital Status:   Intimate Partner Violence:   . Fear of Current or Ex-Partner:   . Emotionally Abused:   Marland Kitchen Physically Abused:   . Sexually Abused:     No Known Allergies  Current Outpatient Medications  Medication Sig Dispense Refill  . fexofenadine (ALLEGRA) 30 MG/5ML suspension Take 30 mg by mouth daily.    Marland Kitchen loratadine (CLARITIN) 10 MG tablet Take 10 mg by mouth daily.    . meloxicam (MOBIC) 7.5 MG tablet Take 1 tablet (7.5 mg total) by mouth daily. (Patient  not taking: Reported on 12/01/2017) 30 tablet 0  . triamcinolone (NASACORT AQ) 55 MCG/ACT nasal inhaler Place 2 sprays into the nose daily. (Patient not taking: Reported on 12/01/2017) 1 Inhaler 12   No current facility-administered medications for this visit.    REVIEW OF SYSTEMS:  [X]  denotes positive finding, [ ]  denotes negative finding Cardiac  Comments:  Chest pain or chest pressure:    Shortness of breath upon exertion:    Short of breath when lying flat:    Irregular heart rhythm:        Vascular    Pain in calf, thigh, or hip brought on by ambulation:    Pain in feet at night that wakes you up from your sleep:     Blood clot in your veins:    Leg swelling:         Pulmonary    Oxygen at home:    Productive cough:     Wheezing:         Neurologic    Sudden weakness in arms or legs:     Sudden numbness in arms or legs:     Sudden onset of difficulty speaking or slurred speech:    Temporary loss of vision in one eye:     Problems with dizziness:         Gastrointestinal    Blood in stool:     Vomited blood:         Genitourinary    Burning when urinating:     Blood in urine:        Psychiatric    Major depression:         Hematologic    Bleeding problems:    Problems with blood clotting too easily:        Skin    Rashes or ulcers:        Constitutional    Fever or chills:     PHYSICAL EXAM:   Vitals:   04/03/20 0847  BP: (!) 93/58  Pulse: (!) 58  Resp: 18  Temp: 97.9 F (36.6 C)  TempSrc: Temporal  SpO2: 99%  Weight: 122 lb (55.3 kg)  Height: 5' (1.524 m)    GENERAL: The patient is a well-nourished female, in no acute distress. The vital signs are documented above. CARDIAC: There is a regular rate and rhythm.  VASCULAR: I do not detect carotid bruits. She has palpable pedal pulses. She has some dilated varicose veins in both eyes. I looked at both great saphenous veins myself with the SonoSite and these veins were not significantly  dilated. PULMONARY: There is good air exchange  bilaterally without wheezing or rales. ABDOMEN: Soft and non-tender with normal pitched bowel sounds.  MUSCULOSKELETAL: There are no major deformities or cyanosis. NEUROLOGIC: No focal weakness or paresthesias are detected. SKIN: There are no ulcers or rashes noted. PSYCHIATRIC: The patient has a normal affect.  DATA:    There was no formal venous reflux testing.

## 2022-09-28 ENCOUNTER — Ambulatory Visit: Payer: Self-pay | Admitting: *Deleted

## 2022-09-28 VITALS — BP 118/70 | Wt 130.0 lb

## 2022-09-28 DIAGNOSIS — Z1211 Encounter for screening for malignant neoplasm of colon: Secondary | ICD-10-CM

## 2022-09-28 DIAGNOSIS — Z1239 Encounter for other screening for malignant neoplasm of breast: Secondary | ICD-10-CM

## 2022-09-28 NOTE — Patient Instructions (Signed)
Explained breast self awareness with Randol Kern. Patient did not need a Pap smear today due to last Pap smear and HPV typing was in April 2023 per patient. Let her know [facility] will cover Pap smears  and HPV Typing every 5 years unless has a history of abnormal Pap smears. Referred patient to [facility] for a right breast biopsy per recommendation. Appointment scheduled Friday, October 01, 2022 at 1300. Mirta Mally Ryall verbalized understanding.  Pearl Bents, Kathaleen Maser, RN 2:41 PM

## 2022-09-28 NOTE — Progress Notes (Signed)
[name] is a 51 y.o. female who presents to [facility] clinic today with no complaints. Patient referred to [facility] by [facility] due to a right breast biopsy is recommended for follow up. Patient had a screening mammogram completed 09/03/2022 and diagnostic mammogram 09/15/2022.   Pap Smear: Pap smear not completed today. Last Pap smear was in April 2023 at [facility] clinic and was normal with negative HPV per patient. Per patient has no history of an abnormal Pap smear. Last Pap smear result is not available in Epic.   Physical exam: Breasts Right breast slightly larger than left that per patient has not noticed any changes. No skin abnormalities bilateral breasts. No nipple retraction bilateral breasts. No nipple discharge bilateral breasts. No lymphadenopathy. No lumps palpated bilateral breasts.       Pelvic/Bimanual Pap is not indicated today.   Smoking History: Patient has never smoked.   Patient Navigation: Patient education provided. Access to services provided for patient through [facility] program.   Colorectal Cancer Screening: Per patient has never had colonoscopy completed. FIT Test will be mailed to patient to complete. No complaints today.    Breast and Cervical Cancer Risk Assessment: Patient does not have family history of breast cancer, known genetic mutations, or radiation treatment to the chest before age 36. Patient does not have history of cervical dysplasia, immunocompromised, or DES exposure in-utero.  Risk Scores as of 09/28/2022     Tina Porter           5-year 0.95 %   Lifetime 9.19 %   This patient is [place] but has no documented birth country, so the [place] model used data from [place] patients to calculate their risk score. Document a birth country in the Demographics activity for a more accurate score.         Last calculated by Tina Porter, CMA on 09/28/2022 at  2:31 PM        A: [facility] exam without pap smear No  complaints.  P: Referred patient to [facility] for a right breast biopsy per recommendation. Appointment scheduled Friday, October 01, 2022 at 1300.  Tina Heidelberg, RN 09/28/2022 2:40 PM

## 2022-10-01 ENCOUNTER — Other Ambulatory Visit: Payer: Self-pay

## 2022-10-12 NOTE — Progress Notes (Signed)
FIT test has been mailed to the pt 10/12/2022. 

## 2023-03-16 DIAGNOSIS — M7989 Other specified soft tissue disorders: Secondary | ICD-10-CM

## 2023-10-06 ENCOUNTER — Ambulatory Visit: Payer: No Typology Code available for payment source
